# Patient Record
Sex: Female | Born: 1995 | Race: Black or African American | Hispanic: No | Marital: Single | Smoking: Never smoker
Health system: Southern US, Community
[De-identification: ages and names within clinical notes are randomized; demographics above are authoritative.]

## PROBLEM LIST (undated history)

## (undated) DIAGNOSIS — J45909 Unspecified asthma, uncomplicated: Secondary | ICD-10-CM

---

## 2018-10-04 ENCOUNTER — Encounter (HOSPITAL_COMMUNITY): Payer: Self-pay | Admitting: Family Medicine

## 2018-10-04 ENCOUNTER — Ambulatory Visit (HOSPITAL_COMMUNITY)
Admission: EM | Admit: 2018-10-04 | Discharge: 2018-10-04 | Disposition: A | Discharge status: Home / Self Care | Payer: Self-pay | Attending: Family Medicine | Admitting: Family Medicine

## 2018-10-04 ENCOUNTER — Other Ambulatory Visit: Payer: Self-pay

## 2018-10-04 DIAGNOSIS — M545 Low back pain, unspecified: Secondary | ICD-10-CM

## 2018-10-04 HISTORY — DX: Unspecified asthma, uncomplicated: J45.909

## 2018-10-04 MED ORDER — CYCLOBENZAPRINE HCL 10 MG PO TABS: 5.0000 mg | ORAL_TABLET | Freq: Every day | ORAL | 0 refills | Status: DC

## 2018-10-04 NOTE — ED Triage Notes (Signed)
Pt states she has back pain x 1 week.  

## 2018-10-04 NOTE — Discharge Instructions (Signed)
Monitor for any worsening symptoms or signs to include more severe back pain, swelling, fever, chills, body aches or increased warmth to the area. Continue with ibuprofen as needed and muscle relaxer at bedtime Be aware the muscle relaxer will make you drowsy Follow up as needed for continued or worsening symptoms

## 2018-10-04 NOTE — ED Provider Notes (Signed)
[facility]    CSN: [id number] Arrival date & time: 10/04/18  1101     History   Chief Complaint Chief Complaint  Patient presents with  . Back Pain    HPI Ashley Peterson is a 22 y.o. female.   Pt is a 22 year old female that presents for lower back/coccyx pain that has improved over the past week. She has been taking ibuprofen with relief. 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 She had some swelling a few days ago in the area that has improved.  She also had recent URI with a lot of coughing. She denies any specific injury. She denies any fever, chills, body aches. No urinary symptoms.   ROS per HPI      Past Medical History:  Diagnosis Date  . Asthma     There are no active problems to display for this patient.   History reviewed. No pertinent surgical history.  OB History   None      Home Medications    Prior to Admission medications   Medication Sig Start Date End Date Taking? Authorizing Provider  cyclobenzaprine (FLEXERIL) 10 MG tablet Take 0.5 tablets (5 mg total) by mouth at bedtime. 10/04/18   Janace Aris, NP    Family History History reviewed. No pertinent family history.  Social History Social History   Tobacco Use  . Smoking status: Never Smoker  . Smokeless tobacco: Never Used  Substance Use Topics  . Alcohol use: Not Currently  . Drug use: Never     Allergies   Patient has no allergy information on record.   Review of Systems Review of Systems   Physical Exam Triage Vital Signs ED Triage Vitals  Enc Vitals Group     BP 10/04/18 1131 (!) 113/59     Pulse Rate 10/04/18 1131 68     Resp 10/04/18 1131 18     Temp 10/04/18 1131 98.1 F (36.7 C)     Temp Source 10/04/18 1131 Oral     SpO2 10/04/18 1131 100 %     Weight --      Height --      Head Circumference --      Peak Flow --      Pain Score 10/04/18 1152 2     Pain Loc --      Pain Edu? --      Excl. in GC? --    No data  found.  Updated Vital Signs BP (!) 113/59 (BP Location: Left Arm)   Pulse 68   Temp 98.1 F (36.7 C) (Oral)   Resp 18   LMP 09/20/2018   SpO2 100%   Visual Acuity Right Eye Distance:   Left Eye Distance:   Bilateral Distance:    Right Eye Near:   Left Eye Near:    Bilateral Near:     Physical Exam  Constitutional: She appears well-developed and well-nourished.  Very pleasant. Non toxic or ill appearing.   HENT:  Head: Normocephalic and atraumatic.  Eyes: Conjunctivae are normal.  Neck: Normal range of motion.  Pulmonary/Chest: Effort normal.  Musculoskeletal: Normal range of motion. She exhibits no edema, tenderness or deformity.  Non tender to the lower lumbar spine or para vertebral musculature.  Mildly  tender to coccyx area and left gluteal cleft.  No abscess appreciated. No redness, increased warmth. No obvious swelling. No bruising or deformity.    Neurological: She is alert.  Skin:  Skin is warm and dry.  Psychiatric: She has a normal mood and affect.  Nursing note and vitals reviewed.    UC Treatments / Results  Labs (all labs ordered are listed, but only abnormal results are displayed) Labs Reviewed - No data to display  EKG None  Radiology No results found.  Procedures Procedures (including critical care time)  Medications Ordered in UC Medications - No data to display  Initial Impression / Assessment and Plan / UC Course  I have reviewed the triage vital signs and the nursing notes.  Pertinent labs & imaging results that were available during my care of the patient were reviewed by me and considered in my medical decision making (see chart for details).     Mostly likely this is a strained muscle from either coughing or strenuous activity at work.  She reports that the area was more swollen a few days ago and the problem has improved. She reports that ibuprofen helps. Could be muscle spasm.  I prescribed flexeril at bedtime as needed to see  if this helps.  Instructed to monitor for worsening symptoms where we would be concerned for underlying abscess.  Pt agreed and understanding.  Final Clinical Impressions(s) / UC Diagnoses   Final diagnoses:  Acute midline low back pain without sciatica     Discharge Instructions     Monitor for any worsening symptoms or signs to include more severe back pain, swelling, fever, chills, body aches or increased warmth to the area. Continue with ibuprofen as needed and muscle relaxer at bedtime Be aware the muscle relaxer will make you drowsy Follow up as needed for continued or worsening symptoms     ED Prescriptions    Medication Sig Dispense Auth. Provider   cyclobenzaprine (FLEXERIL) 10 MG tablet Take 0.5 tablets (5 mg total) by mouth at bedtime. 20 tablet Dahlia Byes A, NP     Controlled Substance Prescriptions [place] Controlled Substance Registry consulted? Not Applicable   Janace Aris, NP 10/04/18 1228

## 2019-02-17 ENCOUNTER — Encounter (HOSPITAL_COMMUNITY): Payer: Self-pay

## 2019-02-17 ENCOUNTER — Other Ambulatory Visit: Payer: Self-pay

## 2019-02-17 ENCOUNTER — Ambulatory Visit (HOSPITAL_COMMUNITY)
Admission: EM | Admit: 2019-02-17 | Discharge: 2019-02-17 | Disposition: A | Discharge status: Home / Self Care | Payer: BLUE CROSS/BLUE SHIELD | Attending: Urgent Care | Admitting: Urgent Care

## 2019-02-17 DIAGNOSIS — L0591 Pilonidal cyst without abscess: Secondary | ICD-10-CM | POA: Insufficient documentation

## 2019-02-17 MED ORDER — NAPROXEN 500 MG PO TABS: 500.0000 mg | ORAL_TABLET | Freq: Two times a day (BID) | ORAL | 0 refills | Status: DC

## 2019-02-17 NOTE — ED Notes (Signed)
Patient able to ambulate independently  

## 2019-02-17 NOTE — ED Provider Notes (Signed)
  MRN: [id number] DOB: January 15, 1996  Subjective:   Ashley Peterson is a 23 y.o. female presenting for several day history of recurrent buttock pain/infection.  Patient reports that she has had drainage before, has been given antibiotics in the past but this particular abscess keeps recurring.  Has not tried any medications currently.  She is trying to keep it covered but is difficult because it is draining.  No current facility-administered medications for this encounter.   Current Outpatient Medications:  .  cyclobenzaprine (FLEXERIL) 10 MG tablet, Take 0.5 tablets (5 mg total) by mouth at bedtime., Disp: 20 tablet, Rfl: 0 .  naproxen (NAPROSYN) 500 MG tablet, Take 1 tablet (500 mg total) by mouth 2 (two) times daily with a meal., Disp: 30 tablet, Rfl: 0   No Known Allergies  Past Medical History:  Diagnosis Date  . Asthma     History reviewed. No pertinent surgical history.  ROS Denies fever, nausea, vomiting, swelling, abdominal pain.  Objective:   Vitals: BP 130/67 (BP Location: Right Arm)   Pulse 75   Temp 98 F (36.7 C) (Oral)   Resp 20   LMP 02/15/2019   SpO2 100%   Physical Exam Constitutional:      General: She is not in acute distress.    Appearance: Normal appearance. She is well-developed. She is not ill-appearing.  HENT:     Head: Normocephalic and atraumatic.     Nose: Nose normal.     Mouth/Throat:     Mouth: Mucous membranes are moist.     Pharynx: Oropharynx is clear.  Eyes:     General: No scleral icterus.    Extraocular Movements: Extraocular movements intact.     Pupils: Pupils are equal, round, and reactive to light.  Cardiovascular:     Rate and Rhythm: Normal rate.  Pulmonary:     Effort: Pulmonary effort is normal.  Genitourinary:   Skin:    General: Skin is warm and dry.  Neurological:     General: No focal deficit present.     Mental Status: She is alert and oriented to person, place, and time.  Psychiatric:        Mood and Affect:  Mood normal.        Behavior: Behavior normal.     Assessment and Plan :   Pilonidal cyst without infection  Counseled patient that she needs to have general surgery to fully resolve her recurring pilonidal cyst.  Wound culture obtained.  We will start her on antibiotics based off of culture results.  Patient is to establish care with her primary care provider as soon as possible, seek referral to [facility].  Naproxen for pain inflammation in the meantime. Counseled patient on potential for adverse effects with medications prescribed today, patient verbalized understanding. ER and return-to-clinic precautions discussed, patient verbalized understanding.    Wallis Bamberg, [place] 02/17/19 1629

## 2019-02-17 NOTE — ED Triage Notes (Signed)
Pt presents with abscess on crack of buttocks.

## 2019-02-17 NOTE — Discharge Instructions (Addendum)
You may take 500mg  Tylenol every 6 hours for pain and inflammation. You can use naproxen with this.   Wallis Bamberg, PA-C [facility] [address], [place], [place] [address] [phone]

## 2019-02-19 LAB — AEROBIC CULTURE W GRAM STAIN (SUPERFICIAL SPECIMEN): Culture: NORMAL

## 2019-02-19 LAB — AEROBIC CULTURE  (SUPERFICIAL SPECIMEN)

## 2020-02-14 ENCOUNTER — Other Ambulatory Visit: Payer: Self-pay

## 2020-02-14 ENCOUNTER — Encounter (HOSPITAL_COMMUNITY): Payer: Self-pay | Admitting: Obstetrics and Gynecology

## 2020-02-14 ENCOUNTER — Inpatient Hospital Stay (HOSPITAL_COMMUNITY): Payer: Self-pay

## 2020-02-14 ENCOUNTER — Inpatient Hospital Stay (HOSPITAL_COMMUNITY)
Admission: AD | Admit: 2020-02-14 | Discharge: 2020-02-14 | Disposition: A | Discharge status: Home / Self Care | Payer: Self-pay | Attending: Obstetrics and Gynecology | Admitting: Obstetrics and Gynecology

## 2020-02-14 DIAGNOSIS — O209 Hemorrhage in early pregnancy, unspecified: Secondary | ICD-10-CM | POA: Insufficient documentation

## 2020-02-14 DIAGNOSIS — Z3A01 Less than 8 weeks gestation of pregnancy: Secondary | ICD-10-CM | POA: Insufficient documentation

## 2020-02-14 DIAGNOSIS — O26891 Other specified pregnancy related conditions, first trimester: Secondary | ICD-10-CM

## 2020-02-14 DIAGNOSIS — R1030 Lower abdominal pain, unspecified: Secondary | ICD-10-CM | POA: Insufficient documentation

## 2020-02-14 DIAGNOSIS — R109 Unspecified abdominal pain: Secondary | ICD-10-CM

## 2020-02-14 DIAGNOSIS — O3680X0 Pregnancy with inconclusive fetal viability, not applicable or unspecified: Secondary | ICD-10-CM

## 2020-02-14 LAB — URINALYSIS, ROUTINE W REFLEX MICROSCOPIC
Bilirubin Urine: NEGATIVE
Glucose, UA: NEGATIVE mg/dL
Ketones, ur: NEGATIVE mg/dL
Leukocytes,Ua: NEGATIVE
Nitrite: NEGATIVE
Protein, ur: 30 mg/dL — AB
RBC / HPF: >50 RBC/hpf — ABNORMAL HIGH (ref 0–5)
Specific Gravity, Urine: 1.023 (ref 1.005–1.030)
pH: 5 (ref 5.0–8.0)

## 2020-02-14 LAB — CBC
HCT: 39.2 % (ref 36.0–46.0)
Hemoglobin: 12.8 g/dL (ref 12.0–15.0)
MCH: 26.8 pg (ref 26.0–34.0)
MCHC: 32.7 g/dL (ref 30.0–36.0)
MCV: 82 fL (ref 80.0–100.0)
Platelets: 217 10*3/uL (ref 150–400)
RBC: 4.78 MIL/uL (ref 3.87–5.11)
RDW: 13.7 % (ref 11.5–15.5)
WBC: 8 10*3/uL (ref 4.0–10.5)
nRBC: 0 % (ref 0.0–0.2)

## 2020-02-14 LAB — WET PREP, GENITAL
Clue Cells Wet Prep HPF POC: NONE SEEN
Sperm: NONE SEEN
Trich, Wet Prep: NONE SEEN
Yeast Wet Prep HPF POC: NONE SEEN

## 2020-02-14 LAB — POCT PREGNANCY, URINE: Preg Test, Ur: POSITIVE — AB

## 2020-02-14 LAB — HCG, QUANTITATIVE, PREGNANCY: hCG, Beta Chain, Quant, S: 306 m[IU]/mL — ABNORMAL HIGH (ref <5)

## 2020-02-14 NOTE — MAU Note (Signed)
Presents with c/o VB that began Saturday.  Reports +HPT 2 weeks ago.  Also reports abdominal cramping.  LMP 01/03/2020.

## 2020-02-14 NOTE — MAU Provider Note (Addendum)
Chief Complaint: Vaginal Bleeding   First Provider Initiated Contact with Patient 02/14/20 [phone]     SUBJECTIVE HPI: Ashley Peterson is a 24 y.o. G1P0 at [redacted]w[redacted]d who presents to [facility]missions reporting abdominal cramping & Vaginal bleeding. Bleeding started a few days ago. Has progressively gotten heavier but hasn't needed to wear anything more than a panty liner. Has passed a few small clots this morning. Reports lower abdominal cramping. No recent intercourse. No dysuria, or change in discharge/vaginal itching or irritation.  Has not been seen anywhere yet with this pregnancy.   Location: abdomen Quality: cramping Severity: 5/10 on pain scale Duration: 2 days Timing: intermittent Modifying factors: none Associated signs and symptoms: vaginal bleeding  Past Medical History:  Diagnosis Date   Asthma    OB History  Gravida Para Term Preterm AB Living  1            SAB TAB Ectopic Multiple Live Births               # Outcome Date GA Lbr Len/2nd Weight Sex Delivery Anes PTL Lv  1 Current            History reviewed. No pertinent surgical history. Social History   Socioeconomic History   Marital status: Single    Spouse name: Not on file   Number of children: Not on file   Years of education: Not on file   Highest education level: Not on file  Occupational History   Not on file  Tobacco Use   Smoking status: Never Smoker   Smokeless tobacco: Never Used  Substance and Sexual Activity   Alcohol use: Not Currently   Drug use: Not Currently    Types: Marijuana    Comment: last use was two weeks ago    Sexual activity: Yes    Birth control/protection: Condom  Other Topics Concern   Not on file  Social History Narrative   Not on file   Social Determinants of Health   Financial Resource Strain:    Difficulty of Paying Living Expenses: Not on file  Food Insecurity:    Worried About Charity fundraiser in the Last Year: Not on file   [employer] of Food in the Last  Year: Not on file  Transportation Needs:    Lack of Transportation (Medical): Not on file   Lack of Transportation (Non-Medical): Not on file  Physical Activity:    Days of Exercise per Week: Not on file   Minutes of Exercise per Session: Not on file  Stress:    Feeling of Stress : Not on file  Social Connections:    Frequency of Communication with Friends and Family: Not on file   Frequency of Social Gatherings with Friends and Family: Not on file   Attends Religious Services: Not on file   Active Member of Clubs or Organizations: Not on file   Attends Archivist Meetings: Not on file   Marital Status: Not on file  Intimate Partner Violence:    Fear of Current or Ex-Partner: Not on file   Emotionally Abused: Not on file   Physically Abused: Not on file   Sexually Abused: Not on file   No family history on file. No current facility-administered medications on file prior to encounter.   Current Outpatient Medications on File Prior to Encounter  Medication Sig Dispense Refill   ALBUTEROL IN Inhale 2 puffs into the lungs as needed.     No Known Allergies  I have reviewed patient's Past Medical Hx, Surgical Hx, Family Hx, Social Hx, medications and allergies.   Review of Systems  Constitutional: Negative.   Gastrointestinal: Positive for abdominal pain. Negative for constipation, diarrhea, nausea and vomiting.  Genitourinary: Positive for vaginal bleeding.    OBJECTIVE Patient Vitals for the past 24 hrs:  BP Temp Temp src Pulse Resp SpO2 Height Weight  02/14/20 1138 -- 98.1 F (36.7 C) Oral 67 16 99 % -- --  02/14/20 0816 133/74 98.7 F (37.1 C) Oral 69 20 100 % -- --  02/14/20 7169 -- -- -- -- -- -- 5\' 3"  (1.6 m) 80.5 kg   Constitutional: Well-developed, well-nourished female in no acute distress.  Cardiovascular: normal rate & rhythm, no murmur Respiratory: normal rate and effort. Lung sounds clear throughout GI: Abd soft, non-tender, Pos BS x 4. No guarding  or rebound tenderness MS: Extremities nontender, no edema, normal ROM Neurologic: Alert and oriented x 4.  GU:     SPECULUM EXAM: NEFG, small amount of dark red blood cleared out with 2 fox swabs; no active bleeding. Cervix pink/smooth/not friable  BIMANUAL: No CMT. cervix closed; uterus normal size, no adnexal tenderness or masses.    LAB RESULTS Results for orders placed or performed during the hospital encounter of 02/14/20 (from the past 24 hour(s))  Pregnancy, urine POC     Status: Abnormal   Collection Time: 02/14/20  8:57 AM  Result Value Ref Range   Preg Test, Ur POSITIVE (A) NEGATIVE  Urinalysis, Routine w reflex microscopic     Status: Abnormal   Collection Time: 02/14/20  9:01 AM  Result Value Ref Range   Color, Urine YELLOW YELLOW   APPearance HAZY (A) CLEAR   Specific Gravity, Urine 1.023 1.005 - 1.030   pH 5.0 5.0 - 8.0   Glucose, UA NEGATIVE NEGATIVE mg/dL   Hgb urine dipstick LARGE (A) NEGATIVE   Bilirubin Urine NEGATIVE NEGATIVE   Ketones, ur NEGATIVE NEGATIVE mg/dL   Protein, ur 30 (A) NEGATIVE mg/dL   Nitrite NEGATIVE NEGATIVE   Leukocytes,Ua NEGATIVE NEGATIVE   RBC / HPF >50 (H) 0 - 5 RBC/hpf   WBC, UA 11-20 0 - 5 WBC/hpf   Bacteria, UA MANY (A) NONE SEEN   Squamous Epithelial / LPF 0-5 0 - 5  Wet prep, genital     Status: Abnormal   Collection Time: 02/14/20 10:05 AM  Result Value Ref Range   Yeast Wet Prep HPF POC NONE SEEN NONE SEEN   Trich, Wet Prep NONE SEEN NONE SEEN   Clue Cells Wet Prep HPF POC NONE SEEN NONE SEEN   WBC, Wet Prep HPF POC MANY (A) NONE SEEN   Sperm NONE SEEN   CBC     Status: None   Collection Time: 02/14/20 10:08 AM  Result Value Ref Range   WBC 8.0 4.0 - 10.5 K/uL   RBC 4.78 3.87 - 5.11 MIL/uL   Hemoglobin 12.8 12.0 - 15.0 g/dL   HCT 04/15/20 67.8 - 93.8 %   MCV 82.0 80.0 - 100.0 fL   MCH 26.8 26.0 - 34.0 pg   MCHC 32.7 30.0 - 36.0 g/dL   RDW 10.1 75.1 - 02.5 %   Platelets 217 150 - 400 K/uL   nRBC 0.0 0.0 - 0.2 %  ABO/Rh      Status: None   Collection Time: 02/14/20 10:08 AM  Result Value Ref Range   ABO/RH(D)      O POS Performed at [facility]  [facility], [address] [place], [place] [address]   hCG, quantitative, pregnancy     Status: Abnormal   Collection Time: 02/14/20 10:08 AM  Result Value Ref Range   hCG, Beta Chain, Quant, S 306 (H) <5 mIU/mL    IMAGING US OB LESS THAN 14 WEEKS WITH OB TRANSVAGINAL  Result Date: 02/14/2020 CLINICAL DATA:  Bleeding and spotting for 4 days with cramping. Gestational age by last menstrual period of 6 weeks 0 days. LMP reported 01/03/2020. EXAM: OBSTETRIC <14 WK [place] AND TRANSVAGINAL OB US TECHNIQUE: Both transabdominal and transvaginal ultrasound examinations were performed for complete evaluation of the gestation as well as the maternal uterus, adnexal regions, and pelvic cul-de-sac. Transvaginal technique was performed to assess early pregnancy. COMPARISON:  None FINDINGS: Intrauterine gestational sac: None Maternal uterus/adnexae: Small free fluid in the cul-de-sac. This appears simple. Endometrial stripe is normal. Appearance of the uterus is unremarkable. Right ovary measuring 3.5 x 2.5 x 2.7 cm. Area of slight increased echogenicity, measuring approximately 1 x 0.9 x 0.7 cm and with increased vascularity arising from the inferior aspect of the right ovary. Left ovary measuring 3.4 x 1.1 x 1.8 cm. No signs of adnexal mass. IMPRESSION: Pregnancy of unknown anatomic location (no intrauterine gestational sac or adnexal mass identified). Differential diagnosis includes recent spontaneous miscarriage, IUP too early to visualize, and non-visualized ectopic pregnancy. Recommend correlation with serial beta-hCG levels, and follow up US. Mild area of increased echogenicity and vascularity associated with right ovary is of uncertain significance. Attention on follow-up is suggested. This is likely an involuting corpus luteum cyst; however, ultrasound follow-up in 7-10 days or based on  clinical symptoms is suggested. Electronically Signed   By: Donzetta Kohut M.D.   On: 02/14/2020 11:20    MAU COURSE Orders Placed This Encounter  Procedures   Wet prep, genital   US OB LESS THAN 14 WEEKS WITH OB TRANSVAGINAL   Urinalysis, Routine w reflex microscopic   CBC   hCG, quantitative, pregnancy   Pregnancy, urine POC   ABO/Rh   Discharge patient   No orders of the defined types were placed in this encounter.   MDM +UPT UA, wet prep, GC/chlamydia, CBC, ABO/Rh, quant hCG, and [place] today to rule out ectopic pregnancy which can be life threatening.   RH positive  Ultrasound shows no IUP. Possible involuting CLC. HCG today is 306.  Likely a failed pregnancy since patient's first positive pregnancy test was 2 weeks ago; but can't exclude ectopic or early pregnancy at this time. Will bring back for stat HCG on Thursday.   ASSESSMENT 1. Pregnancy of unknown anatomic location   2. Vaginal bleeding in pregnancy, first trimester   3. Abdominal pain during pregnancy in first trimester     PLAN Discharge home in stable condition. SAB vs ectopic precautions Pt to return to MAU on Thursday for repeat HCG (can't come to the office during office hours) GC/CT pending  Follow-up Information     [facility] Follow up.   Specialty: Obstetrics and Gynecology Why: return on Thursday for lab draw or sooner for worsening symptoms Contact information: [address] [id number] mc [place] [address] [phone]          Allergies as of 02/14/2020   No Known Allergies      Medication List     STOP taking these medications    cyclobenzaprine 10 MG tablet Commonly known as: FLEXERIL   naproxen 500 MG tablet Commonly  known as: NAPROSYN       TAKE these medications    ALBUTEROL IN Inhale 2 puffs into the lungs as needed.         Judeth Horn, NP 02/14/2020  11:54 AM   Attestation: Medical screening  examination/treatment/procedure(s) were performed by non-physician practitioner and as supervising physician I was immediately available for consultation/collaboration. Catalina Pizza, MD

## 2020-02-14 NOTE — Discharge Instructions (Signed)
Return to care   If you have heavier bleeding that soaks through more that 2 pads per hour for an hour or more  If you bleed so much that you feel like you might pass out or you do pass out  If you have significant abdominal pain that is not improved with Tylenol     Vaginal Bleeding During Pregnancy, First Trimester  A small amount of bleeding from the vagina (spotting) is relatively common during early pregnancy. It usually stops on its own. Various things may cause bleeding or spotting during early pregnancy. Some bleeding may be related to the pregnancy, and some may not. In many cases, the bleeding is normal and is not a problem. However, bleeding can also be a sign of something serious. Be sure to tell your health care provider about any vaginal bleeding right away. Some possible causes of vaginal bleeding during the first trimester include:  Infection or inflammation of the cervix.  Growths (polyps) on the cervix.  Miscarriage or threatened miscarriage.  Pregnancy tissue developing outside of the uterus (ectopic pregnancy).  A mass of tissue developing in the uterus due to an egg being fertilized incorrectly (molar pregnancy). Follow these instructions at home: Activity  Follow instructions from your health care provider about limiting your activity. Ask what activities are safe for you.  If needed, make plans for someone to help with your regular activities.  Do not have sex or orgasms until your health care provider says that this is safe. General instructions  Take over-the-counter and prescription medicines only as told by your health care provider.  Pay attention to any changes in your symptoms.  Do not use tampons or douche.  Write down how many pads you use each day, how often you change pads, and how soaked (saturated) they are.  If you pass any tissue from your vagina, save the tissue so you can show it to your health care provider.  Keep all follow-up  visits as told by your health care provider. This is important. Contact a health care provider if:  You have vaginal bleeding during any part of your pregnancy.  You have cramps or labor pains.  You have a fever. Get help right away if:  You have severe cramps in your back or abdomen.  You pass large clots or a large amount of tissue from your vagina.  Your bleeding increases.  You feel light-headed or weak, or you faint.  You have chills.  You are leaking fluid or have a gush of fluid from your vagina. Summary  A small amount of bleeding (spotting) from the vagina is relatively common during early pregnancy.  Various things may cause bleeding or spotting in early pregnancy.  Be sure to tell your health care provider about any vaginal bleeding right away. This information is not intended to replace advice given to you by your health care provider. Make sure you discuss any questions you have with your health care provider. Document Revised: 03/15/2019 Document Reviewed: 02/26/2017 Elsevier Patient Education  2020 [employer]   

## 2020-02-15 LAB — GC/CHLAMYDIA PROBE AMP (~~LOC~~) NOT AT ARMC
Chlamydia: NEGATIVE
Comment: NEGATIVE
Comment: NORMAL
Neisseria Gonorrhea: NEGATIVE

## 2020-02-15 LAB — ABO/RH: ABO/RH(D): O POS

## 2020-06-19 ENCOUNTER — Ambulatory Visit (HOSPITAL_COMMUNITY)
Admission: EM | Admit: 2020-06-19 | Discharge: 2020-06-19 | Disposition: A | Discharge status: Home / Self Care | Payer: Self-pay | Attending: Internal Medicine | Admitting: Internal Medicine

## 2020-06-19 ENCOUNTER — Encounter (HOSPITAL_COMMUNITY): Payer: Self-pay | Admitting: Emergency Medicine

## 2020-06-19 ENCOUNTER — Other Ambulatory Visit: Payer: Self-pay

## 2020-06-19 DIAGNOSIS — L0501 Pilonidal cyst with abscess: Secondary | ICD-10-CM

## 2020-06-19 MED ORDER — DOXYCYCLINE HYCLATE 100 MG PO TABS: 100.0000 mg | ORAL_TABLET | Freq: Two times a day (BID) | ORAL | 0 refills | Status: DC

## 2020-06-19 MED ORDER — DOXYCYCLINE HYCLATE 100 MG PO TABS: 100.0000 mg | ORAL_TABLET | Freq: Two times a day (BID) | ORAL | 0 refills | Status: AC

## 2020-06-19 NOTE — ED Provider Notes (Signed)
[facility]    CSN: [id number] Arrival date & time: 06/19/20  1547      History   Chief Complaint Chief Complaint  Patient presents with  . Abscess    HPI Ashley Peterson is a 24 y.o. female with history of asthma presents to urgent care with painful bump on buttocks.  Patient reports history of same area has become quite painful with little relief from warm washcloth.  She denies any drainage from the area.  No recent fever or chills.  Patient requesting drainage of area.    Past Medical History:  Diagnosis Date  . Asthma     There are no problems to display for this patient.   History reviewed. No pertinent surgical history.  OB History    Gravida  1   Para      Term      Preterm      AB      Living        SAB      TAB      Ectopic      Multiple      Live Births               Home Medications    Prior to Admission medications   Medication Sig Start Date End Date Taking? Authorizing Provider  ALBUTEROL IN Inhale 2 puffs into the lungs as needed.    [provider]  doxycycline (VIBRA-TABS) 100 MG tablet Take 1 tablet (100 mg total) by mouth 2 (two) times daily for 10 days. 06/19/20 06/29/20  Rolla Etienne, NP    Family History No family history on file.  Social History Social History   Tobacco Use  . Smoking status: Never Smoker  . Smokeless tobacco: Never Used  Vaping Use  . Vaping Use: Never used  Substance Use Topics  . Alcohol use: Not Currently  . Drug use: Not Currently    Types: Marijuana    Comment: last use was two weeks ago      Allergies   Patient has no known allergies.   Review of Systems As stated in HPI otherwise negative   Physical Exam Triage Vital Signs ED Triage Vitals  Enc Vitals Group     BP 06/19/20 1642 117/64     Pulse Rate 06/19/20 1642 84     Resp 06/19/20 1642 18     Temp 06/19/20 1642 98.6 F (37 C)     Temp Source 06/19/20 1642 Oral     SpO2 06/19/20 1642 99 %      Weight --      Height --      Head Circumference --      Peak Flow --      Pain Score 06/19/20 1638 7     Pain Loc --      Pain Edu? --      Excl. in GC? --    No data found.  Updated Vital Signs BP 117/64 (BP Location: Right Arm)   Pulse 84   Temp 98.6 F (37 C) (Oral)   Resp 18   LMP 05/23/2020   SpO2 99%   Breastfeeding Unknown   Physical Exam Constitutional:      General: She is not in acute distress.    Appearance: Normal appearance.  Skin:    General: Skin is warm and dry.     Comments: Area of swelling and erythema on left gluteal fold.  Slight area of  fluctuance.  Area tender to palpation.  No drainage  Neurological:     Mental Status: She is alert.     UC Treatments / Results  Labs (all labs ordered are listed, but only abnormal results are displayed) Labs Reviewed - No data to display  EKG   Radiology No results found.  Procedures Incision and Drainage  Date/Time: 06/19/2020 9:20 PM Performed by: Rolla Etienne, NP Authorized by: Rolla Etienne, NP   Consent:    Consent obtained:  Verbal   Consent given by:  Patient   Risks discussed:  Incomplete drainage and pain   Alternatives discussed:  Observation Location:    Type:  Pilonidal cyst   Size:  5cm Pre-procedure details:    Skin preparation:  Betadine Procedure details:    Incision types:  Stab incision   Incision depth:  Dermal   Drainage:  Bloody   Drainage amount:  Scant   Packing materials:  None Post-procedure details:    Patient tolerance of procedure:  Tolerated well, no immediate complications   (including critical care time)  Medications Ordered in UC Medications - No data to display  Initial Impression / Assessment and Plan / UC Course  I have reviewed the triage vital signs and the nursing notes.  Pertinent labs & imaging results that were available during my care of the patient were reviewed by me and considered in my medical decision making (see chart for  details).  Pilonidal abscess -Patient with history of same and I&D in the past.  Abscess with little fluctuance but attempted to drain per patient's request with minimal results -Doxy p.o. twice daily x7 days -Return for any fever or chills, worsening swelling or erythema -Consider surgical consult if continues to reoccur  Final Clinical Impressions(s) / UC Diagnoses   Final diagnoses:  Pilonidal abscess     Discharge Instructions     You can take a sitz bath as needed to help relieve your discomfort.  Take antibiotic as prescribed.  Return for any increased swelling or fever.  May help to buy cleanser with salicylic acid in it.     ED Prescriptions    Medication Sig Dispense Auth. Provider   doxycycline (VIBRA-TABS) 100 MG tablet  (Status: Discontinued) Take 1 tablet (100 mg total) by mouth 2 (two) times daily for 7 days. 14 tablet Rolla Etienne, NP   doxycycline (VIBRA-TABS) 100 MG tablet Take 1 tablet (100 mg total) by mouth 2 (two) times daily for 10 days. 20 tablet Rolla Etienne, NP     PDMP not reviewed this encounter.   Rolla Etienne, NP 06/19/20 2124

## 2020-06-19 NOTE — Discharge Instructions (Addendum)
You can take a sitz bath as needed to help relieve your discomfort.  Take antibiotic as prescribed.  Return for any increased swelling or fever.  May help to buy cleanser with salicylic acid in it.

## 2020-06-19 NOTE — ED Triage Notes (Signed)
Patient has cyst, recurrent.  Located gluteal cleft, onset wednesday or Thursday and has worsened since onset

## 2021-03-04 IMAGING — US US OB < 14 WEEKS - US OB TV
1 series · 14 of 28 positions shown · non-contrast
Comparison: None
COMPARISON: None

Addendum:
CLINICAL DATA: Bleeding and spotting for 4 days with cramping.
Gestational age by last menstrual period of 6 weeks 0 days. LMP
reported 01/03/2020.

EXAM:
OBSTETRIC <14 WK US AND TRANSVAGINAL OB US
TECHNIQUE: Both transabdominal and transvaginal ultrasound examinations were
performed for complete evaluation of the gestation as well as the
maternal uterus, adnexal regions, and pelvic cul-de-sac.
Transvaginal technique was performed to assess early pregnancy.

[Series 1: us ob < 14 weeks - us ob tv · 60 acquisitions, 14 frames shown]
[im 3/60]
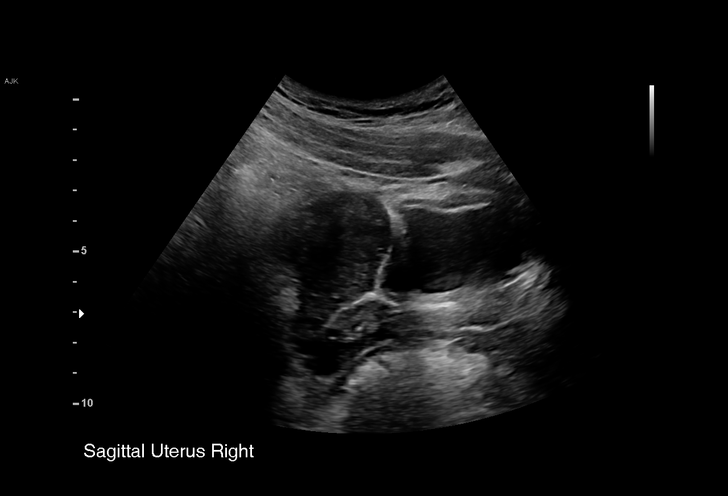
[im 7/60]
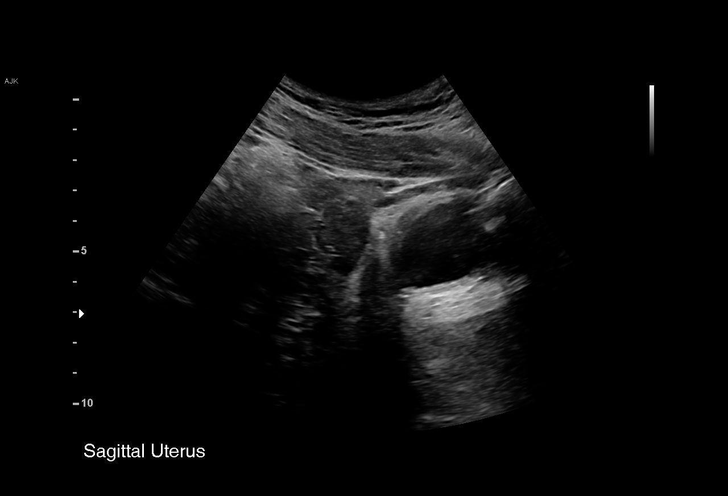
[im 11/60]
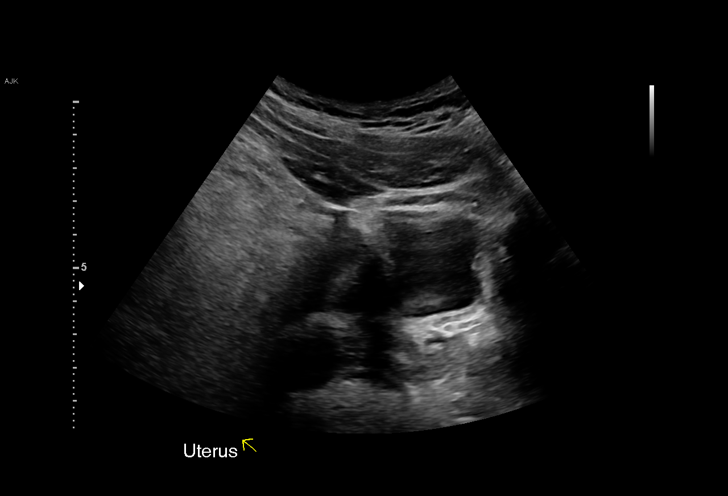
[im 16/60]
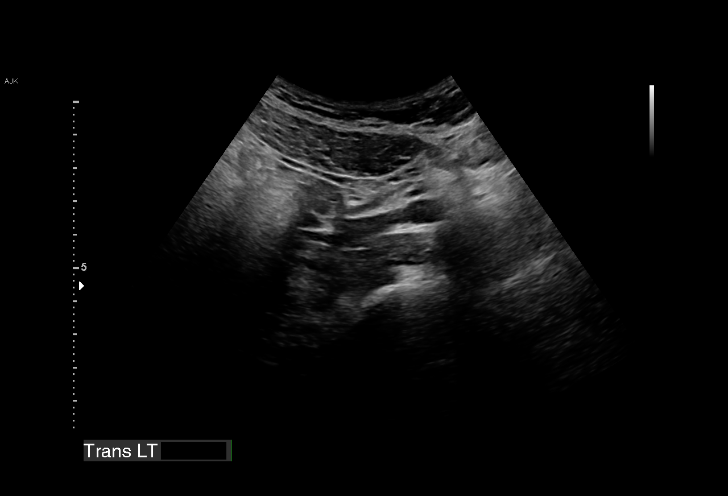
[im 20/60]
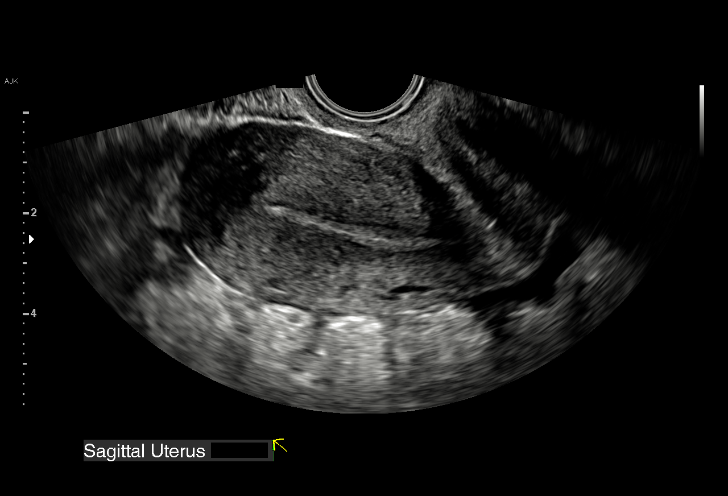
[im 25/60]
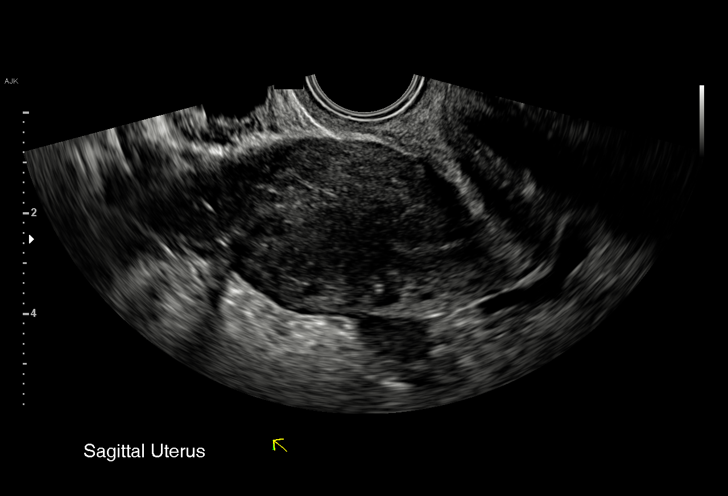
[im 29/60]
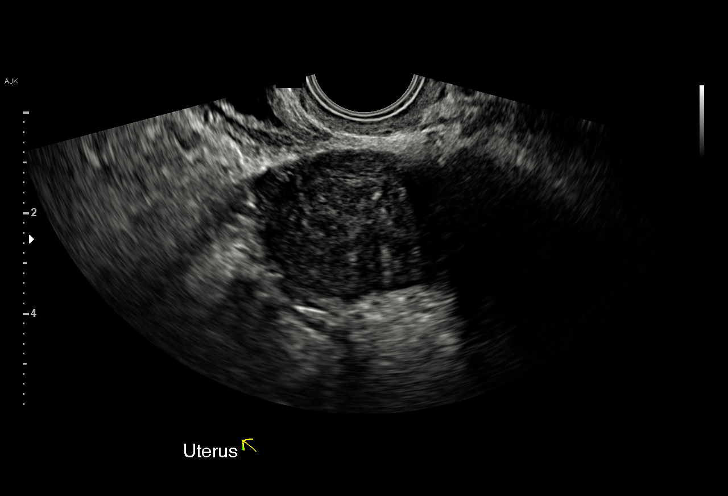
[im 33/60]
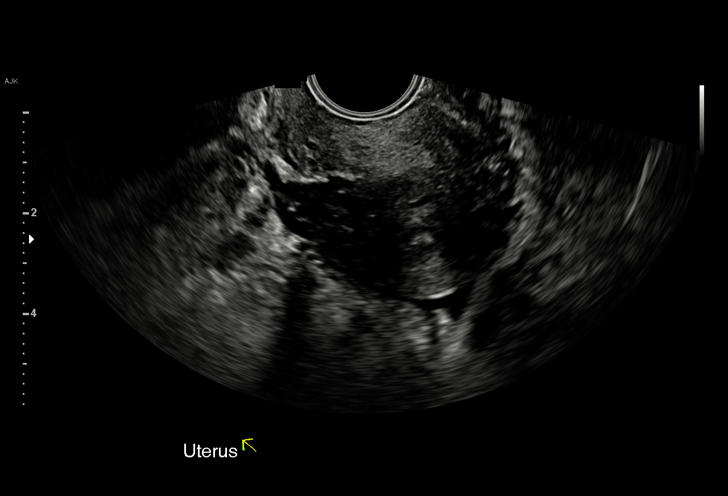
[im 38/60]
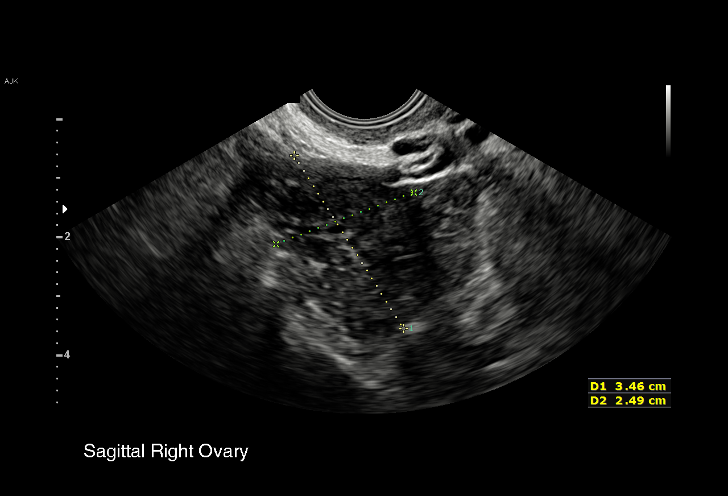
[im 42/60]
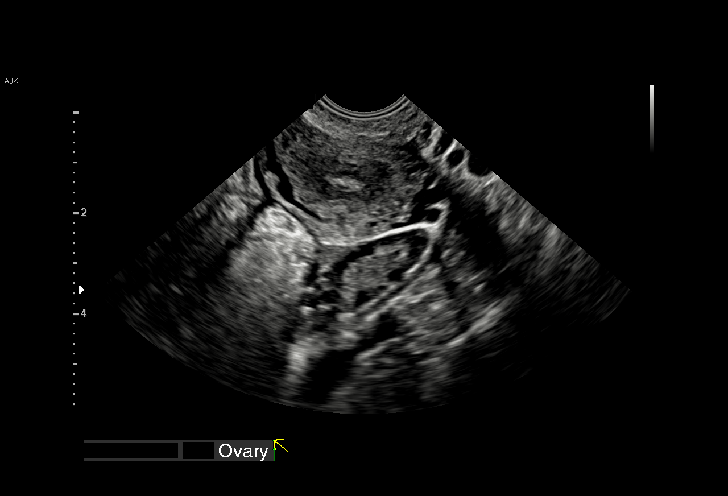
[im 46/60]
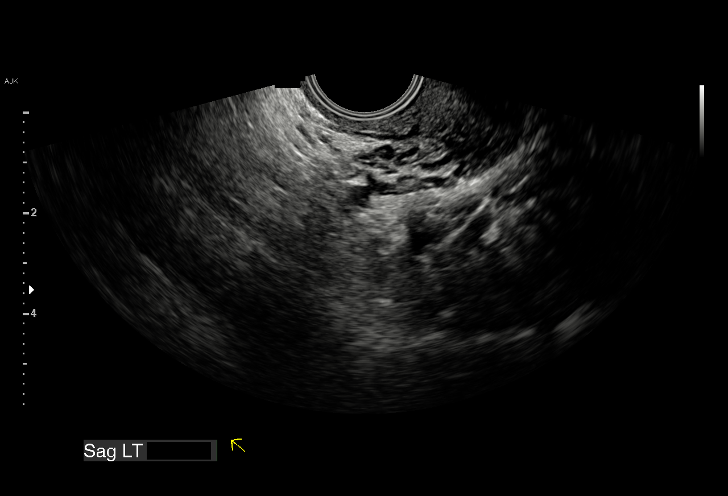
[im 51/60]
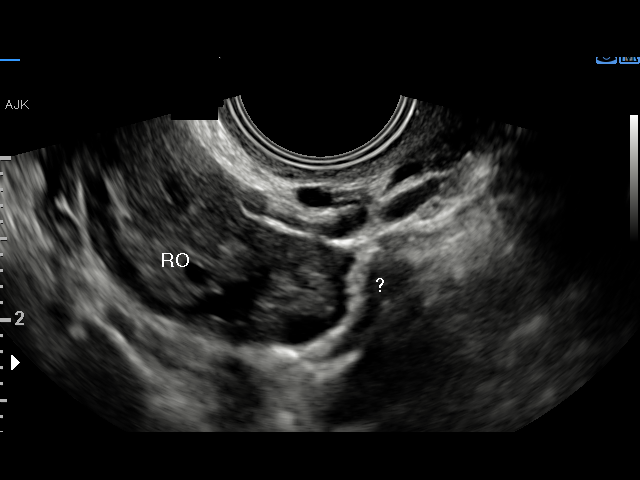
[im 55/60]
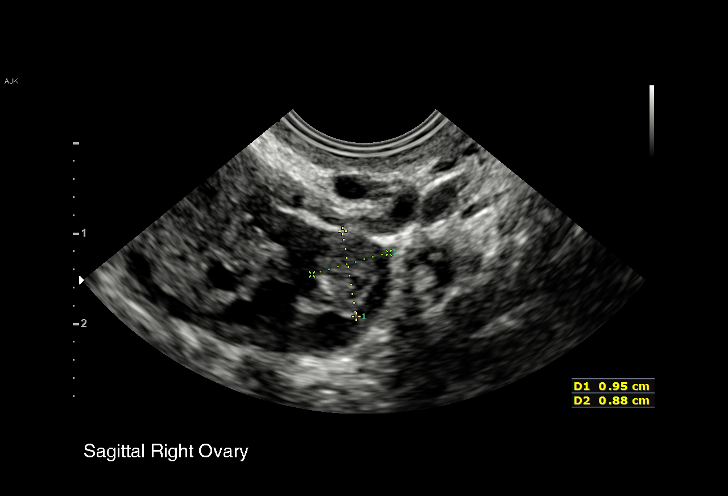
[im 60/60]
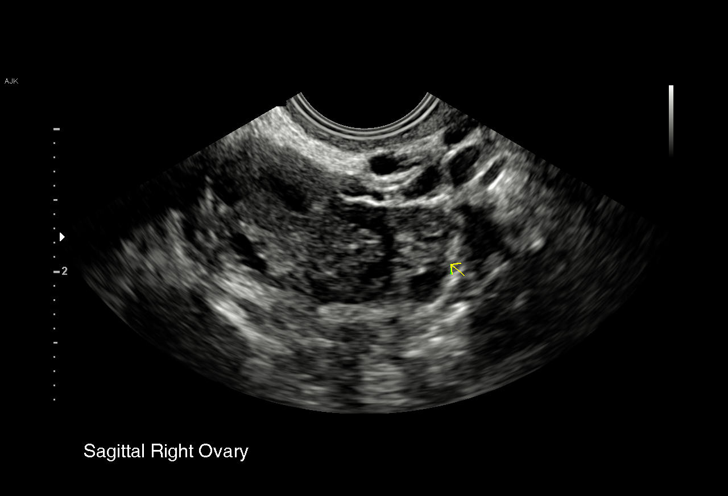

[14 of 28 positions shown; findings below may reference images not displayed]

FINDINGS: Intrauterine gestational sac: None

Maternal uterus/adnexae: Small free fluid in the cul-de-sac. This
appears simple.

Endometrial stripe is normal. Appearance of the uterus is
unremarkable.

Right ovary measuring 3.5 x 2.5 x 2.7 cm. Area of slight increased
echogenicity, measuring approximately 1 x 0.9 x 0.7 cm and with
increased vascularity arising from the inferior aspect of the right
ovary.

Left ovary measuring 3.4 x 1.1 x 1.8 cm.

No signs of adnexal mass.
IMPRESSION: Pregnancy of unknown anatomic location (no intrauterine gestational
sac or adnexal mass identified). Differential diagnosis includes
recent spontaneous miscarriage, IUP too early to visualize, and
non-visualized ectopic pregnancy. Recommend correlation with serial
beta-hCG levels, and follow up US.

Mild area of increased echogenicity and vascularity associated with
right ovary is of uncertain significance. Attention on follow-up is
suggested. This is likely an involuting corpus luteum cyst; however,
ultrasound follow-up in 7-10 days or based on clinical symptoms is
suggested.

ADDENDUM:
Findings of the vague area in the lower portion of the right ovary
were discussed via telephone with the provider caring for the
patient. Findings remain of uncertain significance. The possibility
of a very early ectopic was discussed. For this reason a short
interval follow-up sonography may be helpful for further assessment
as along with serial beta HCG. [REDACTED] follow-up given
findings.

These results were called by telephone at the time of interpretation
acknowledged these results.

*** End of Addendum ***
FINDINGS: Intrauterine gestational sac: None

Maternal uterus/adnexae: Small free fluid in the cul-de-sac. This
appears simple.

Endometrial stripe is normal. Appearance of the uterus is
unremarkable.

Right ovary measuring 3.5 x 2.5 x 2.7 cm. Area of slight increased
echogenicity, measuring approximately 1 x 0.9 x 0.7 cm and with
increased vascularity arising from the inferior aspect of the right
ovary.

Left ovary measuring 3.4 x 1.1 x 1.8 cm.

No signs of adnexal mass.
IMPRESSION: Pregnancy of unknown anatomic location (no intrauterine gestational
sac or adnexal mass identified). Differential diagnosis includes
recent spontaneous miscarriage, IUP too early to visualize, and
non-visualized ectopic pregnancy. Recommend correlation with serial
beta-hCG levels, and follow up US.

Mild area of increased echogenicity and vascularity associated with
right ovary is of uncertain significance. Attention on follow-up is
suggested. This is likely an involuting corpus luteum cyst; however,
ultrasound follow-up in 7-10 days or based on clinical symptoms is
suggested.
